# Patient Record
Sex: Female | Born: 1935 | Race: White | Marital: Married | Smoking: Never smoker
Health system: Southern US, Community
[De-identification: ages and names within clinical notes are randomized; demographics above are authoritative.]

## PROBLEM LIST (undated history)

## (undated) DIAGNOSIS — C50919 Malignant neoplasm of unspecified site of unspecified female breast: Secondary | ICD-10-CM

## (undated) DIAGNOSIS — C189 Malignant neoplasm of colon, unspecified: Secondary | ICD-10-CM

## (undated) DIAGNOSIS — C569 Malignant neoplasm of unspecified ovary: Secondary | ICD-10-CM

## (undated) HISTORY — PX: HERNIA REPAIR: SHX51

## (undated) HISTORY — PX: APPENDECTOMY: SHX54

---

## 2012-04-24 ENCOUNTER — Encounter (HOSPITAL_COMMUNITY): Payer: Self-pay | Admitting: Emergency Medicine

## 2012-04-24 ENCOUNTER — Emergency Department (HOSPITAL_COMMUNITY): Payer: Medicare Other

## 2012-04-24 ENCOUNTER — Emergency Department (HOSPITAL_COMMUNITY)
Admission: EM | Admit: 2012-04-24 | Discharge: 2012-04-24 | Disposition: A | Discharge status: Home / Self Care | Payer: Medicare Other | Attending: Emergency Medicine | Admitting: Emergency Medicine

## 2012-04-24 DIAGNOSIS — W03XXXA Other fall on same level due to collision with another person, initial encounter: Secondary | ICD-10-CM

## 2012-04-24 DIAGNOSIS — Z8543 Personal history of malignant neoplasm of ovary: Secondary | ICD-10-CM | POA: Insufficient documentation

## 2012-04-24 DIAGNOSIS — M549 Dorsalgia, unspecified: Secondary | ICD-10-CM

## 2012-04-24 DIAGNOSIS — Z79899 Other long term (current) drug therapy: Secondary | ICD-10-CM | POA: Insufficient documentation

## 2012-04-24 DIAGNOSIS — W1809XA Striking against other object with subsequent fall, initial encounter: Secondary | ICD-10-CM | POA: Insufficient documentation

## 2012-04-24 DIAGNOSIS — G8929 Other chronic pain: Secondary | ICD-10-CM | POA: Insufficient documentation

## 2012-04-24 DIAGNOSIS — Y929 Unspecified place or not applicable: Secondary | ICD-10-CM | POA: Insufficient documentation

## 2012-04-24 DIAGNOSIS — S0003XA Contusion of scalp, initial encounter: Secondary | ICD-10-CM | POA: Insufficient documentation

## 2012-04-24 DIAGNOSIS — IMO0002 Reserved for concepts with insufficient information to code with codable children: Secondary | ICD-10-CM | POA: Insufficient documentation

## 2012-04-24 DIAGNOSIS — Y939 Activity, unspecified: Secondary | ICD-10-CM | POA: Insufficient documentation

## 2012-04-24 DIAGNOSIS — Z85038 Personal history of other malignant neoplasm of large intestine: Secondary | ICD-10-CM | POA: Insufficient documentation

## 2012-04-24 HISTORY — DX: Malignant neoplasm of unspecified ovary: C56.9

## 2012-04-24 HISTORY — DX: Malignant neoplasm of unspecified site of unspecified female breast: C50.919

## 2012-04-24 HISTORY — DX: Malignant neoplasm of colon, unspecified: C18.9

## 2012-04-24 MED ORDER — HYDROCODONE-ACETAMINOPHEN 5-325 MG PO TABS
1.0000 | ORAL_TABLET | Freq: Once | ORAL | Status: AC
  Administered 2012-04-24: 1 via ORAL
  Filled 2012-04-24: qty 1
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
## 2012-04-24 NOTE — ED Notes (Signed)
Pt wanting to urinate, but didn't want to use the bedpan. Stated will wait until return from radiology
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
## 2012-04-24 NOTE — ED Notes (Signed)
Patient left her room without receiving her discharge instructions.

## 2012-04-24 NOTE — ED Notes (Addendum)
After CT reviewed by Dr Preston Fleeting ok given for pt to get up to use bathroom - pt assisted up to bedside commode .

## 2012-04-24 NOTE — ED Notes (Signed)
Pt taken to radiology

## 2016-08-09 ENCOUNTER — Emergency Department (HOSPITAL_COMMUNITY)
Admission: EM | Admit: 2016-08-09 | Discharge: 2016-08-09 | Disposition: A | Discharge status: Home / Self Care | Payer: Medicare Other | Attending: Emergency Medicine | Admitting: Emergency Medicine

## 2016-08-09 ENCOUNTER — Encounter (HOSPITAL_COMMUNITY): Payer: Self-pay

## 2016-08-09 ENCOUNTER — Emergency Department (HOSPITAL_COMMUNITY): Payer: Medicare Other

## 2016-08-09 DIAGNOSIS — Z8543 Personal history of malignant neoplasm of ovary: Secondary | ICD-10-CM | POA: Diagnosis not present

## 2016-08-09 DIAGNOSIS — Y9389 Activity, other specified: Secondary | ICD-10-CM | POA: Diagnosis not present

## 2016-08-09 DIAGNOSIS — W109XXA Fall (on) (from) unspecified stairs and steps, initial encounter: Secondary | ICD-10-CM | POA: Insufficient documentation

## 2016-08-09 DIAGNOSIS — S0181XA Laceration without foreign body of other part of head, initial encounter: Secondary | ICD-10-CM | POA: Insufficient documentation

## 2016-08-09 DIAGNOSIS — Y998 Other external cause status: Secondary | ICD-10-CM | POA: Insufficient documentation

## 2016-08-09 DIAGNOSIS — Z853 Personal history of malignant neoplasm of breast: Secondary | ICD-10-CM | POA: Insufficient documentation

## 2016-08-09 DIAGNOSIS — Z79899 Other long term (current) drug therapy: Secondary | ICD-10-CM | POA: Diagnosis not present

## 2016-08-09 DIAGNOSIS — Y92009 Unspecified place in unspecified non-institutional (private) residence as the place of occurrence of the external cause: Secondary | ICD-10-CM | POA: Insufficient documentation

## 2016-08-09 DIAGNOSIS — Z85038 Personal history of other malignant neoplasm of large intestine: Secondary | ICD-10-CM | POA: Diagnosis not present

## 2016-08-09 DIAGNOSIS — S0990XA Unspecified injury of head, initial encounter: Secondary | ICD-10-CM | POA: Insufficient documentation

## 2016-08-09 MED ORDER — LIDOCAINE-EPINEPHRINE (PF) 2 %-1:200000 IJ SOLN
20.0000 mL | Freq: Once | INTRAMUSCULAR | Status: AC
  Administered 2016-08-09: 20 mL
  Filled 2016-08-09: qty 20

## 2016-08-09 MED ORDER — LIDOCAINE HCL (PF) 1 % IJ SOLN
5.0000 mL | Freq: Once | INTRAMUSCULAR | Status: DC
  Filled 2016-08-09: qty 5

## 2016-08-09 NOTE — Discharge Instructions (Signed)
Your work up was reassuring today. Please return to ED with any new neurological symptoms or development of any new concerning symptoms.
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
## 2016-08-09 NOTE — ED Provider Notes (Signed)
[facility] DEPT Provider Note   CSN: [id number] Arrival date & time: 08/09/16  1712     History   Chief Complaint Chief Complaint  Patient presents with  . Fall  . Laceration    HPI   HPI Christina Boyer is a 83 y.o. Female who presents after fall with resulting laceration to forehead. Patient was walking up stairs when tripped and hit her head on concrete. Denies any LOC. Not currently on any blood thinners (has been off eliquis for past 6 months for dvt). Some confusion noted by nursing.   Past Medical History:  Diagnosis Date  . Breast CA ([place])    lt breast  . Colon cancer ([place])   . Ovarian cancer ([place])     There are no active problems to display for this patient.   Past Surgical History:  Procedure Laterality Date  . APPENDECTOMY    . HERNIA REPAIR      OB History    No data available       Home Medications    Prior to Admission medications   Medication Sig Start Date End Date Taking? Authorizing Provider  DULoxetine (CYMBALTA) 60 MG capsule Take 60 mg by mouth every morning.    [provider]  gabapentin (NEURONTIN) 300 MG capsule Take 300 mg by mouth at bedtime.    [provider]  hydrochlorothiazide (HYDRODIURIL) 50 MG tablet Take 50 mg by mouth every morning.    [provider]  HYDROcodone-acetaminophen (NORCO/VICODIN) 5-325 MG per tablet Take 1 tablet by mouth every 4 (four) hours as needed for pain.     [provider]    Family History No family history on file.  Social History Social History  Substance Use Topics  . Smoking status: Never Smoker  . Smokeless tobacco: Never Used  . Alcohol use No     Allergies   Bactrim [sulfamethoxazole-trimethoprim]; Ciprofloxacin; Iodides; Macrobid [nitrofurantoin macrocrystal]; and Novocain [procaine hcl]   Review of Systems Review of Systems  Constitutional: Negative for chills and fever.  HENT: Negative for ear pain and sore throat.   Eyes:  Negative for pain and visual disturbance.  Respiratory: Negative for cough and shortness of breath.   Cardiovascular: Negative for chest pain and palpitations.  Gastrointestinal: Negative for abdominal pain and vomiting.  Genitourinary: Negative for dysuria and hematuria.  Musculoskeletal: Negative for arthralgias and back pain.  Skin: Positive for wound. Negative for color change and rash.  Neurological: Positive for headaches. Negative for seizures and syncope.  All other systems reviewed and are negative.    Physical Exam Updated Vital Signs BP (!) 160/78   Pulse 83   Temp 98.1 F (36.7 C) (Oral)   Resp 16   SpO2 100%   Physical Exam  Constitutional: She appears well-developed and well-nourished. No distress.  HENT:  Head: Normocephalic and atraumatic.  Eyes: Conjunctivae are normal.  Neck: Neck supple.  Cardiovascular: Normal rate and regular rhythm.   No murmur heard. Pulmonary/Chest: Effort normal and breath sounds normal. No respiratory distress.  Abdominal: Soft. There is no tenderness.  Musculoskeletal: She exhibits no edema.  Neurological: She is alert.  Skin: Skin is warm and dry.  2x2 cm lac to left forehead  Psychiatric: She has a normal mood and affect.  Nursing note and vitals reviewed.    ED Treatments / Results  Labs (all labs ordered are listed, but only abnormal results are displayed) Labs Reviewed - No data to display  EKG  EKG Interpretation  None       Radiology Ct Head Wo Contrast  Result Date: 08/09/2016 CLINICAL DATA:  83 year old female with fall and trauma to the face. EXAM: CT HEAD WITHOUT CONTRAST CT MAXILLOFACIAL WITHOUT CONTRAST TECHNIQUE: Multidetector CT imaging of the head and maxillofacial structures were performed using the standard protocol without intravenous contrast. Multiplanar CT image reconstructions of the maxillofacial structures were also generated. COMPARISON:  CT dated 04/24/2012 FINDINGS: CT HEAD FINDINGS Brain:  There is moderate age-related atrophy and chronic microvascular ischemic changes. There is no acute intracranial hemorrhage. No mass effect or midline shift noted. No extra-axial fluid collection. Vascular: No hyperdense vessel or unexpected calcification. Skull: Normal. Negative for fracture or focal lesion. Other: Left forehead Skin laceration. Bilateral calcified carotid bulb plaque. CT MAXILLOFACIAL FINDINGS Osseous: No fracture or mandibular dislocation. No destructive process. Orbits: Bilateral cataract surgeries. No traumatic or inflammatory finding. Sinuses: Clear. Soft tissues: Left forehead skin laceration.  No large hematoma. IMPRESSION: 1. No acute intracranial hemorrhage. Moderate age-related atrophy chronic microvascular ischemic changes. 2. No acute/traumatic facial bone fractures. Electronically Signed   By: Anner Crete M.D.   On: 08/09/2016 18:32   Ct Maxillofacial Wo Contrast  Result Date: 08/09/2016 CLINICAL DATA:  83 year old female with fall and trauma to the face. EXAM: CT HEAD WITHOUT CONTRAST CT MAXILLOFACIAL WITHOUT CONTRAST TECHNIQUE: Multidetector CT imaging of the head and maxillofacial structures were performed using the standard protocol without intravenous contrast. Multiplanar CT image reconstructions of the maxillofacial structures were also generated. COMPARISON:  CT dated 04/24/2012 FINDINGS: CT HEAD FINDINGS Brain: There is moderate age-related atrophy and chronic microvascular ischemic changes. There is no acute intracranial hemorrhage. No mass effect or midline shift noted. No extra-axial fluid collection. Vascular: No hyperdense vessel or unexpected calcification. Skull: Normal. Negative for fracture or focal lesion. Other: Left forehead Skin laceration. Bilateral calcified carotid bulb plaque. CT MAXILLOFACIAL FINDINGS Osseous: No fracture or mandibular dislocation. No destructive process. Orbits: Bilateral cataract surgeries. No traumatic or inflammatory finding.  Sinuses: Clear. Soft tissues: Left forehead skin laceration.  No large hematoma. IMPRESSION: 1. No acute intracranial hemorrhage. Moderate age-related atrophy chronic microvascular ischemic changes. 2. No acute/traumatic facial bone fractures. Electronically Signed   By: Anner Crete M.D.   On: 08/09/2016 18:32    Procedures .Marland KitchenLaceration Repair Date/Time: 08/09/2016 8:36 PM Performed by: Arnetha Massy Authorized by: Arnetha Massy   Consent:    Consent obtained:  Verbal   Consent given by:  Patient   Risks discussed:  Infection, need for additional repair, nerve damage, pain, poor cosmetic result and poor wound healing Anesthesia (see MAR for exact dosages):    Anesthesia method:  Local infiltration   Local anesthetic:  Lidocaine 1% WITH epi Laceration details:    Location:  Face   Face location:  Forehead   Length (cm):  3   Depth (mm):  3 Repair type:    Repair type:  Simple Pre-procedure details:    Preparation:  Patient was prepped and draped in usual sterile fashion Exploration:    Hemostasis achieved with:  Epinephrine   Wound exploration: entire depth of wound probed and visualized     Contaminated: no   Treatment:    Area cleansed with:  Saline   Amount of cleaning:  Extensive   Irrigation solution:  Sterile saline   Irrigation method:  Pressure wash Skin repair:    Repair method:  Sutures   Suture size:  5-0   Suture material:  Prolene   Suture technique:  Simple  interrupted   Number of sutures:  8 Approximation:    Approximation:  Close   Vermilion border: well-aligned   Post-procedure details:    Dressing:  Antibiotic ointment   Patient tolerance of procedure:  Tolerated well, no immediate complications   (including critical care time)  Medications Ordered in ED Medications  lidocaine-EPINEPHrine (XYLOCAINE W/EPI) 2 %-1:200000 (PF) injection 20 mL (20 mLs Infiltration Given 08/09/16 1916)     Initial Impression / Assessment and Plan / ED Course  I  have reviewed the triage vital signs and the nursing notes.  Pertinent labs & imaging results that were available during my care of the patient were reviewed by me and considered in my medical decision making (see chart for details).    Head CT and face negative for acute findings. Forehead lac repaired. Discussed return precautions as well as follow up with PCM in 7-10 days for suture removal.   Final Clinical Impressions(s) / ED Diagnoses   Final diagnoses:  Injury of head, initial encounter  Facial laceration, initial encounter    New Prescriptions New Prescriptions   No medications on file     Arnetha Massy, MD 08/09/16 2038    Jola Schmidt, MD 08/10/16 [phone]

## 2018-01-20 IMAGING — CT CT MAXILLOFACIAL W/O CM
3 of 6 series · 16 of 47 positions shown, 19 images · non-contrast
Comparison: CT dated 04/24/2012

CLINICAL DATA: 87-year-old female with fall and trauma to the face.

EXAM:
CT HEAD WITHOUT CONTRAST
CT MAXILLOFACIAL WITHOUT CONTRAST
TECHNIQUE: Multidetector CT imaging of the head and maxillofacial structures
were performed using the standard protocol without intravenous
contrast. Multiplanar CT image reconstructions of the maxillofacial
structures were also generated.

[Series 5: head 3.0 mpr cor · coronal · 0.31mm/px · 3 of 67 slices shown]
[im 17/67  bone]
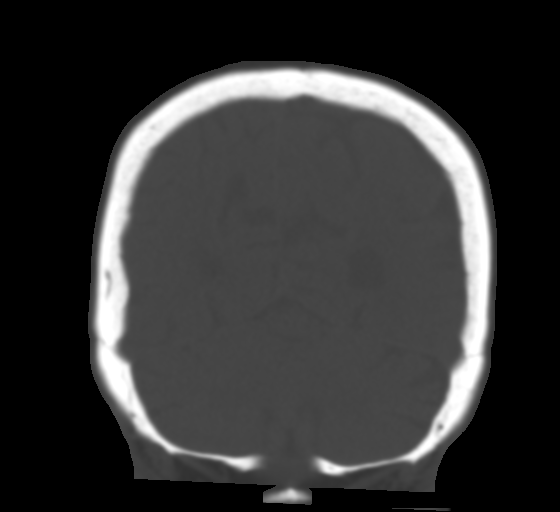
[im 34/67  bone]
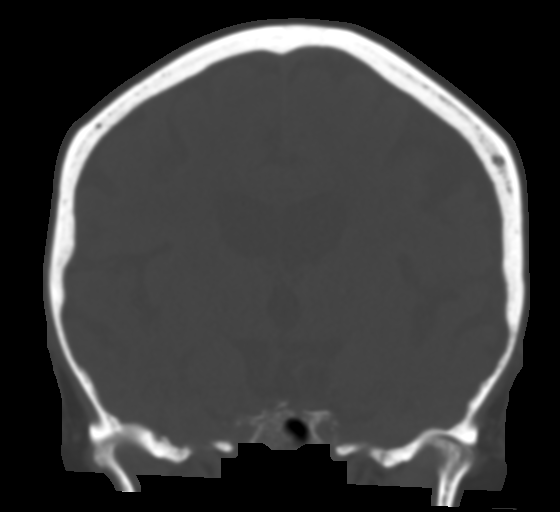
[im 51/67  bone]
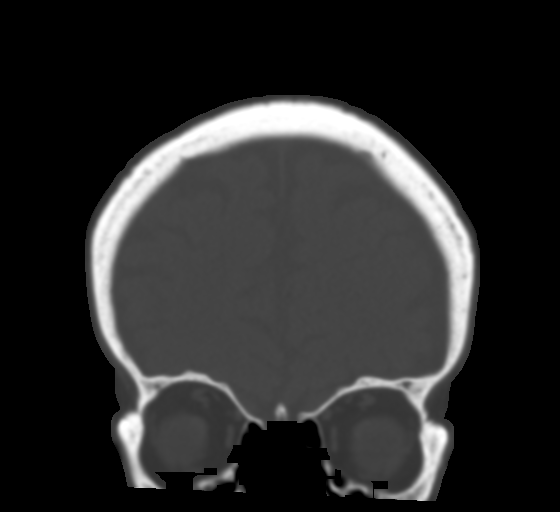

[Series 6: head 3.0 mpr sag · sagittal · 0.31mm/px · 1 of 58 slices shown]
[im 29/58  bone]
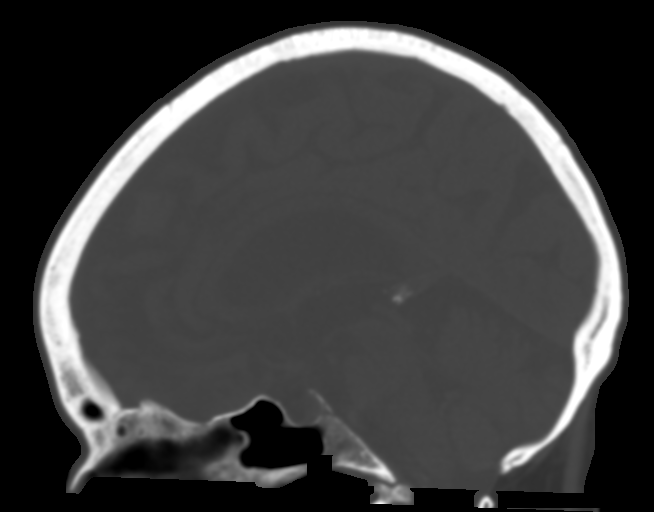

[Series 7: facial/ orbits 2.0 h30s · axial · 0.33mm/px · z∈[-202,-70]mm · 12 of 74 slices shown, 15 images]
[im 4/74  brain]
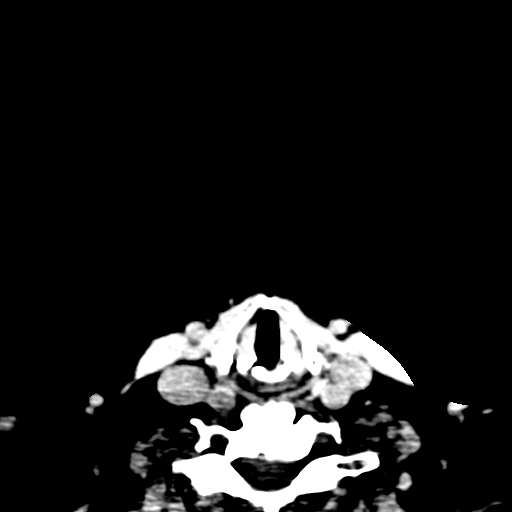
[im 4/74  bone]
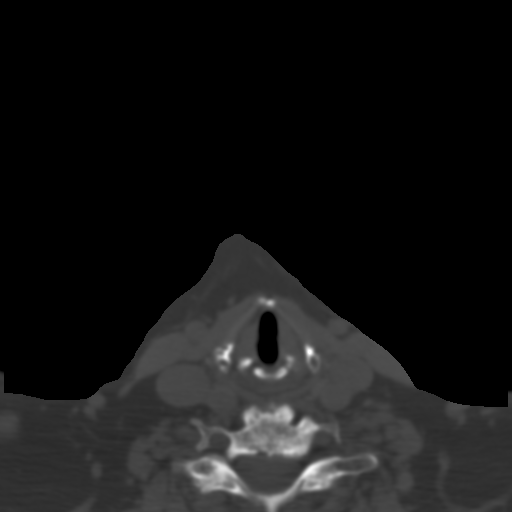
[im 11/74  bone]
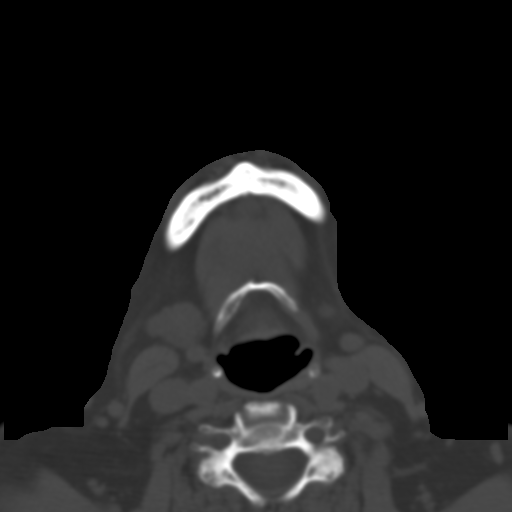
[im 15/74  bone]
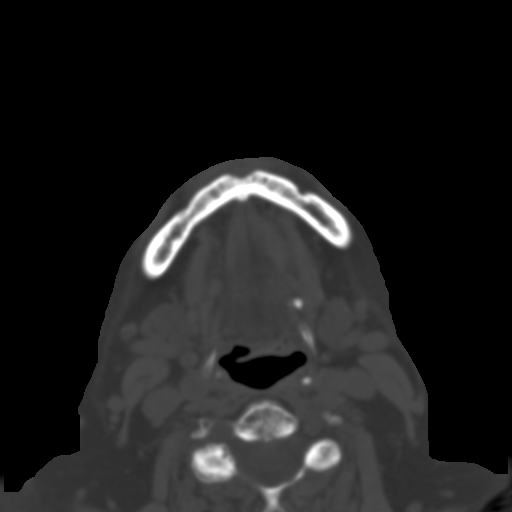
[im 22/74  bone]
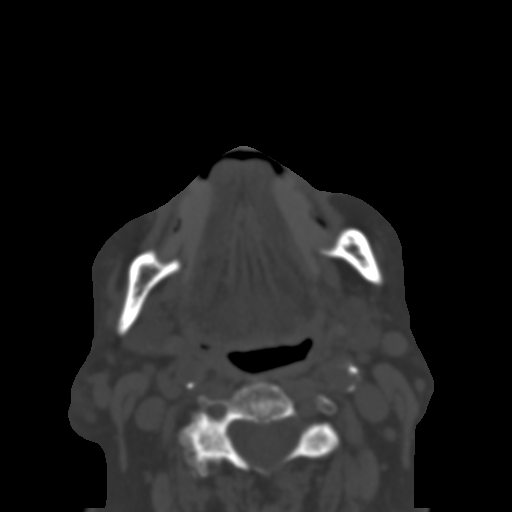
[im 30/74  brain]
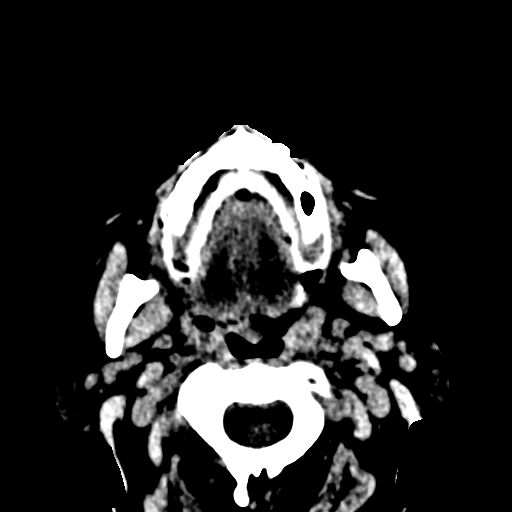
[im 30/74  bone]
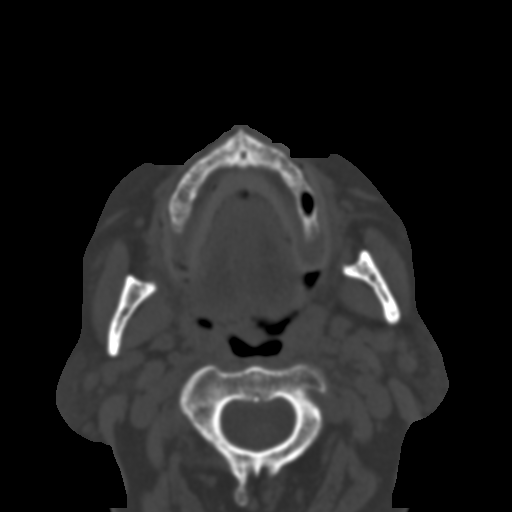
[im 33/74  bone]
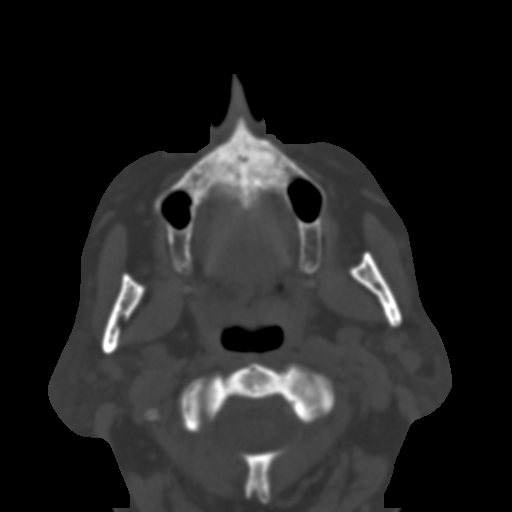
[im 41/74  bone]
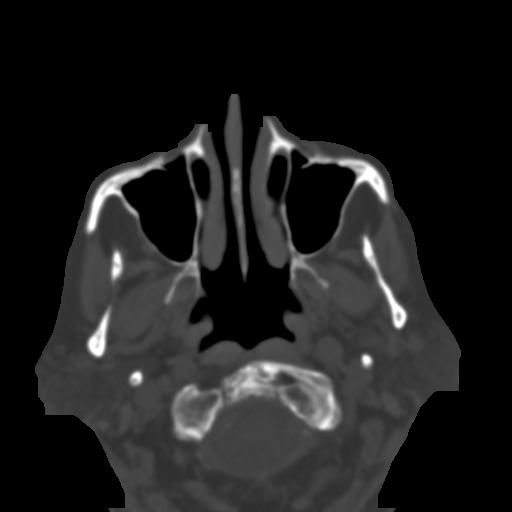
[im 44/74  bone]
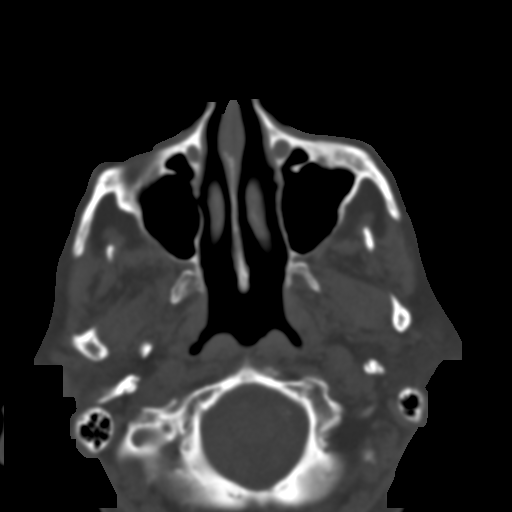
[im 52/74  brain]
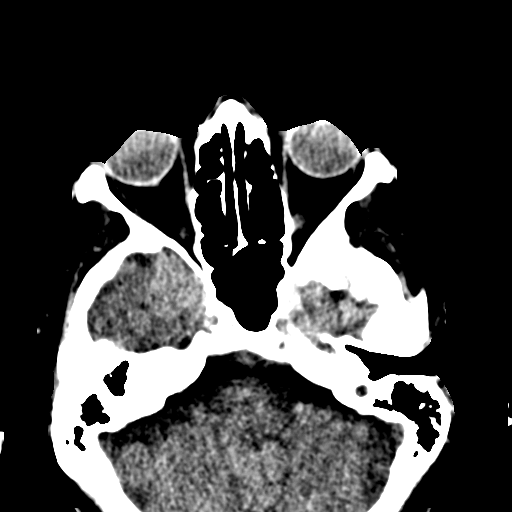
[im 52/74  bone]
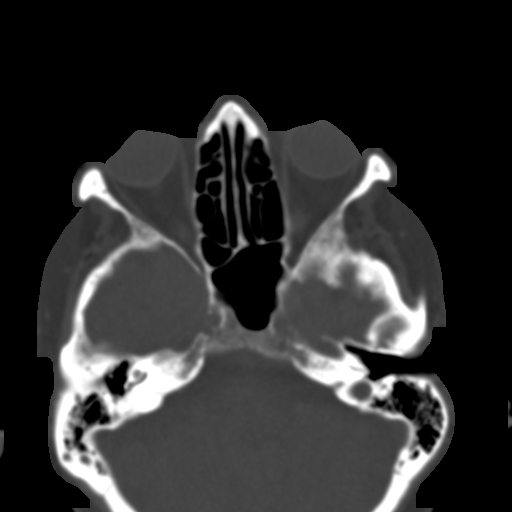
[im 59/74  bone]
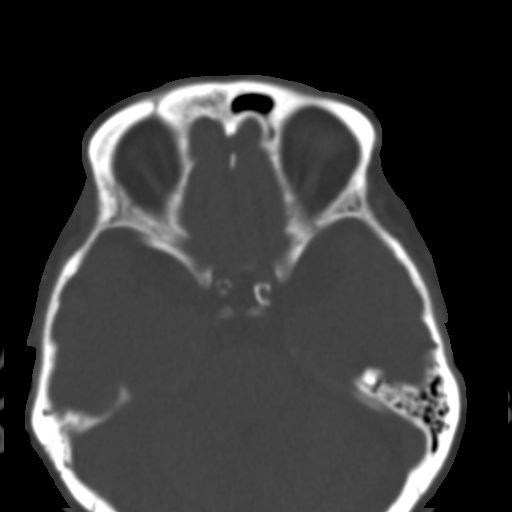
[im 63/74  bone]
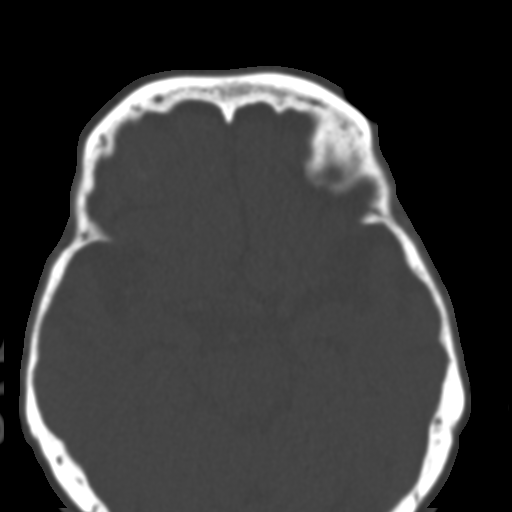
[im 70/74  bone]
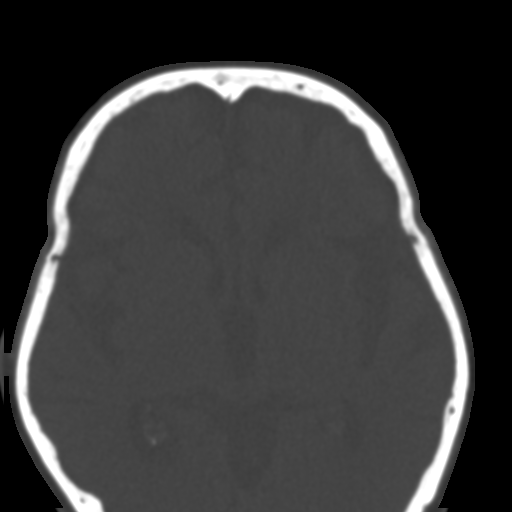

[16 of 47 positions shown; findings below may reference images not displayed]

FINDINGS: CT HEAD FINDINGS

Brain: There is moderate age-related atrophy and chronic
microvascular ischemic changes. There is no acute intracranial
hemorrhage. No mass effect or midline shift noted. No extra-axial
fluid collection.

Vascular: No hyperdense vessel or unexpected calcification.

Skull: Normal. Negative for fracture or focal lesion.

Other: Left forehead Skin laceration. Bilateral calcified carotid
bulb plaque.

CT MAXILLOFACIAL FINDINGS

Osseous: No fracture or mandibular dislocation. No destructive
process.

Orbits: Bilateral cataract surgeries. No traumatic or inflammatory
finding.

Sinuses: Clear.

Soft tissues: Left forehead skin laceration.  No large hematoma.
IMPRESSION: 1. No acute intracranial hemorrhage. Moderate age-related atrophy
chronic microvascular ischemic changes.
2. No acute/traumatic facial bone fractures.

## 2022-12-09 DEATH — deceased
